# Patient Record
Sex: Male | Born: 1985 | Race: Black or African American | Hispanic: No | Marital: Single
Health system: Southern US, Community
[De-identification: ages and names within clinical notes are randomized; demographics above are authoritative.]

---

## 2005-06-26 ENCOUNTER — Emergency Department (HOSPITAL_COMMUNITY): Admission: EM | Admit: 2005-06-26 | Discharge: 2005-06-26 | Payer: Self-pay | Admitting: Emergency Medicine

## 2005-07-06 ENCOUNTER — Emergency Department (HOSPITAL_COMMUNITY): Admission: EM | Admit: 2005-07-06 | Discharge: 2005-07-06 | Payer: Self-pay | Admitting: Emergency Medicine

## 2007-03-21 ENCOUNTER — Emergency Department (HOSPITAL_COMMUNITY): Admission: EM | Admit: 2007-03-21 | Discharge: 2007-03-21 | Payer: Self-pay | Admitting: Emergency Medicine

## 2008-06-22 ENCOUNTER — Emergency Department (HOSPITAL_COMMUNITY): Admission: EM | Admit: 2008-06-22 | Discharge: 2008-06-22 | Payer: Self-pay | Admitting: Emergency Medicine

## 2018-03-11 ENCOUNTER — Other Ambulatory Visit: Payer: Self-pay

## 2018-03-11 ENCOUNTER — Emergency Department (HOSPITAL_COMMUNITY): Payer: No Typology Code available for payment source

## 2018-03-11 ENCOUNTER — Emergency Department (HOSPITAL_COMMUNITY)
Admission: EM | Admit: 2018-03-11 | Discharge: 2018-03-11 | Disposition: A | Discharge status: Home / Self Care | Payer: No Typology Code available for payment source | Attending: Emergency Medicine | Admitting: Emergency Medicine

## 2018-03-11 ENCOUNTER — Encounter (HOSPITAL_COMMUNITY): Payer: Self-pay

## 2018-03-11 DIAGNOSIS — M545 Low back pain, unspecified: Secondary | ICD-10-CM

## 2018-03-11 DIAGNOSIS — M25531 Pain in right wrist: Secondary | ICD-10-CM | POA: Diagnosis not present

## 2018-03-11 MED ORDER — LIDOCAINE 5 % EX PTCH: 1.0000 | MEDICATED_PATCH | CUTANEOUS | 0 refills | Status: AC

## 2018-03-11 MED ORDER — METHOCARBAMOL 500 MG PO TABS: 500.0000 mg | ORAL_TABLET | Freq: Two times a day (BID) | ORAL | 0 refills | Status: AC
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
## 2018-03-11 NOTE — ED Notes (Signed)
Discharge instructions and prescriptions discussed with Pt. Pt verbalized understanding. Pt stable and ambulatory.   
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
## 2018-09-04 ENCOUNTER — Other Ambulatory Visit: Payer: Self-pay

## 2018-09-04 DIAGNOSIS — Z20822 Contact with and (suspected) exposure to covid-19: Secondary | ICD-10-CM

## 2018-09-05 LAB — NOVEL CORONAVIRUS, NAA: SARS-CoV-2, NAA: NOT DETECTED

## 2020-08-14 ENCOUNTER — Telehealth: Payer: Self-pay
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
